# Patient Record
Sex: Female | Born: 1970 | Race: Black or African American | Hispanic: No | Marital: Married | Smoking: Current every day smoker
Health system: Southern US, Community
[De-identification: ages and names within clinical notes are randomized; demographics above are authoritative.]

## PROBLEM LIST (undated history)

## (undated) HISTORY — PX: RIB FRACTURE SURGERY: SHX2358

## (undated) HISTORY — PX: BACK SURGERY: SHX140

## (undated) HISTORY — PX: ORIF SCAPULAR FRACTURE: SUR958

## (undated) HISTORY — PX: CLAVICLE SURGERY: SHX598

## (undated) HISTORY — PX: FRACTURE SURGERY: SHX138

## (undated) HISTORY — PX: NECK SURGERY: SHX720

---

## 1997-12-26 ENCOUNTER — Other Ambulatory Visit: Admission: RE | Admit: 1997-12-26 | Discharge: 1997-12-26 | Payer: Self-pay | Admitting: Obstetrics and Gynecology

## 1998-01-16 ENCOUNTER — Observation Stay (HOSPITAL_COMMUNITY): Admission: RE | Admit: 1998-01-16 | Discharge: 1998-01-17 | Payer: Self-pay | Admitting: Obstetrics and Gynecology

## 1998-04-25 ENCOUNTER — Inpatient Hospital Stay (HOSPITAL_COMMUNITY): Admission: AD | Admit: 1998-04-25 | Discharge: 1998-04-25 | Payer: Self-pay | Admitting: Obstetrics and Gynecology

## 1998-04-28 ENCOUNTER — Inpatient Hospital Stay (HOSPITAL_COMMUNITY): Admission: AD | Admit: 1998-04-28 | Discharge: 1998-04-28 | Payer: Self-pay | Admitting: Obstetrics & Gynecology

## 1998-07-04 ENCOUNTER — Inpatient Hospital Stay (HOSPITAL_COMMUNITY): Admission: AD | Admit: 1998-07-04 | Discharge: 1998-07-04 | Payer: Self-pay | Admitting: Obstetrics & Gynecology

## 1998-07-05 ENCOUNTER — Inpatient Hospital Stay (HOSPITAL_COMMUNITY): Admission: AD | Admit: 1998-07-05 | Discharge: 1998-07-05 | Payer: Self-pay | Admitting: *Deleted

## 1998-07-13 ENCOUNTER — Inpatient Hospital Stay (HOSPITAL_COMMUNITY): Admission: AD | Admit: 1998-07-13 | Discharge: 1998-07-19 | Payer: Self-pay | Admitting: Obstetrics and Gynecology

## 1998-07-20 ENCOUNTER — Encounter (HOSPITAL_COMMUNITY): Admission: RE | Admit: 1998-07-20 | Discharge: 1998-10-18 | Payer: Self-pay | Admitting: Obstetrics and Gynecology

## 1998-08-09 ENCOUNTER — Other Ambulatory Visit: Admission: RE | Admit: 1998-08-09 | Discharge: 1998-08-09 | Payer: Self-pay | Admitting: Obstetrics and Gynecology

## 1999-05-05 ENCOUNTER — Emergency Department (HOSPITAL_COMMUNITY): Admission: EM | Admit: 1999-05-05 | Discharge: 1999-05-05 | Payer: Self-pay | Admitting: Emergency Medicine

## 1999-05-14 ENCOUNTER — Encounter: Admission: RE | Admit: 1999-05-14 | Discharge: 1999-05-14 | Payer: Self-pay | Admitting: General Practice

## 1999-05-14 ENCOUNTER — Encounter: Payer: Self-pay | Admitting: General Practice

## 1999-06-10 ENCOUNTER — Encounter: Admission: RE | Admit: 1999-06-10 | Discharge: 1999-06-10 | Payer: Self-pay | Admitting: General Practice

## 1999-06-10 ENCOUNTER — Encounter: Payer: Self-pay | Admitting: General Practice

## 2003-08-22 ENCOUNTER — Encounter (INDEPENDENT_AMBULATORY_CARE_PROVIDER_SITE_OTHER): Payer: Self-pay | Admitting: Specialist

## 2003-08-22 ENCOUNTER — Ambulatory Visit (HOSPITAL_COMMUNITY): Admission: RE | Admit: 2003-08-22 | Discharge: 2003-08-22 | Payer: Self-pay | Admitting: Obstetrics and Gynecology

## 2003-09-15 ENCOUNTER — Encounter (INDEPENDENT_AMBULATORY_CARE_PROVIDER_SITE_OTHER): Payer: Self-pay | Admitting: *Deleted

## 2003-09-15 ENCOUNTER — Ambulatory Visit (HOSPITAL_COMMUNITY): Admission: RE | Admit: 2003-09-15 | Discharge: 2003-09-15 | Payer: Self-pay | Admitting: Obstetrics and Gynecology

## 2003-10-24 ENCOUNTER — Emergency Department (HOSPITAL_COMMUNITY): Admission: EM | Admit: 2003-10-24 | Discharge: 2003-10-24 | Payer: Self-pay | Admitting: Emergency Medicine

## 2004-02-05 ENCOUNTER — Emergency Department (HOSPITAL_COMMUNITY): Admission: EM | Admit: 2004-02-05 | Discharge: 2004-02-05 | Payer: Self-pay | Admitting: Emergency Medicine

## 2004-04-05 ENCOUNTER — Encounter: Admission: RE | Admit: 2004-04-05 | Discharge: 2004-04-05 | Payer: Self-pay | Admitting: Internal Medicine

## 2004-04-18 ENCOUNTER — Encounter: Admission: RE | Admit: 2004-04-18 | Discharge: 2004-07-17 | Payer: Self-pay | Admitting: Orthopaedic Surgery

## 2004-09-15 ENCOUNTER — Emergency Department (HOSPITAL_COMMUNITY): Admission: EM | Admit: 2004-09-15 | Discharge: 2004-09-15 | Payer: Self-pay | Admitting: Emergency Medicine

## 2005-06-24 ENCOUNTER — Emergency Department (HOSPITAL_COMMUNITY): Admission: EM | Admit: 2005-06-24 | Discharge: 2005-06-24 | Payer: Self-pay | Admitting: Emergency Medicine

## 2006-06-26 ENCOUNTER — Inpatient Hospital Stay (HOSPITAL_COMMUNITY): Admission: EM | Admit: 2006-06-26 | Discharge: 2006-07-08 | Payer: Self-pay | Admitting: Emergency Medicine

## 2006-08-20 ENCOUNTER — Encounter: Admission: RE | Admit: 2006-08-20 | Discharge: 2006-08-20 | Payer: Self-pay | Admitting: Orthopedic Surgery

## 2006-10-13 ENCOUNTER — Encounter: Admission: RE | Admit: 2006-10-13 | Discharge: 2006-10-13 | Payer: Self-pay | Admitting: Orthopedic Surgery

## 2007-06-26 ENCOUNTER — Emergency Department (HOSPITAL_COMMUNITY): Admission: EM | Admit: 2007-06-26 | Discharge: 2007-06-26 | Payer: Self-pay | Admitting: Emergency Medicine
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
## 2009-11-08 ENCOUNTER — Encounter: Admission: RE | Admit: 2009-11-08 | Discharge: 2009-11-08 | Payer: Self-pay | Admitting: Internal Medicine

## 2009-11-14 ENCOUNTER — Encounter: Admission: RE | Admit: 2009-11-14 | Discharge: 2009-11-14 | Payer: Self-pay | Admitting: Internal Medicine
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
## 2010-07-05 NOTE — Op Note (Signed)
Darlene Donaldson, Darlene Donaldson                          ACCOUNT NO.:  [id number]   MEDICAL RECORD NO.:  [id number]                   PATIENT TYPE:  AMB   LOCATION:  SDC                                  FACILITY:  [facility]   PHYSICIAN:  Maxie Better, M.D.            DATE OF BIRTH:  29-Sep-1970   DATE OF PROCEDURE:  08/22/2003  DATE OF DISCHARGE:                                 OPERATIVE REPORT   PREOPERATIVE DIAGNOSIS:  Blighted ovum.   POSTOPERATIVE DIAGNOSIS:  Blighted ovum.   PROCEDURE:  Suction dilatation and evacuation.   ANESTHESIA:  MAC paracervical block.   SURGEON:  Maxie Better, M.D.   INDICATIONS FOR PROCEDURE:  A 40 year old gravida 30, para 2-0-11-2 married  black female who was found to have a blighted ovum on ultrasound on August 02, 2003, and who presents for surgical management after spontaneous miscarriage  did not occur.  Risks and benefits of the procedure had been explained to  the patient.  Consent was signed and the patient was transferred to the  operating room.   DESCRIPTION OF PROCEDURE:  Under adequate monitored anesthesia, the patient  was placed in the dorsal lithotomy position.  She was sterilely prepped and  draped in the usual fashion.  The bladder was catheterized for a moderate  amount of urine.  Examination under anesthesia revealed seven to eight-week  __________ uterus, no adnexal masses could be appreciated.  Bivalve speculum  was placed in the vagina.  Shortened cervix was noted.  There were 20 mL of  1% Nesacaine injected paracervically.  The anterior lip of the cervix was  then grasped with single-tooth tenaculum.  Cervix was serially dilated up to  #25 [facility] dilator.  A 37 mm curved suction cannula was introduced  into the  uterine cavity.  Cavity was suction curetted and resuctioned until tissue  was felt to have been all removed, at which time all instruments were then  removed from the vagina.  Specimen labeled products of conception was  sent  to pathology.   ESTIMATED BLOOD LOSS:  Estimated blood loss was about 100 mL.   COMPLICATIONS:  None.   Maternal blood type was O positive.  The patient tolerated the procedure  well and was transferred to the recovery room in stable condition.                                               Maxie Better, M.D.    [place]/MEDQ  D:  08/22/2003  T:  08/22/2003  Job:  [id number]

## 2010-07-05 NOTE — Op Note (Signed)
NAMESITARA, CASHWELL                          ACCOUNT NO.:  [id number]   MEDICAL RECORD NO.:  [id number]                   PATIENT TYPE:  AMB   LOCATION:  SDC                                  FACILITY:  [facility]   PHYSICIAN:  Richardean Sale, M.D.                DATE OF BIRTH:  05/19/1970   DATE OF PROCEDURE:  09/15/2003  DATE OF DISCHARGE:                                 OPERATIVE REPORT   PREOPERATIVE DIAGNOSES:  Retained products of conception.   POSTOPERATIVE DIAGNOSES:  Retained products of conception.   PROCEDURE:  Dilation and evacuation of uterus, sharp curettage.   SURGEON:  Richardean Sale, M.D.   ANESTHESIA:  General endotracheal.   COMPLICATIONS:  None.   ESTIMATED BLOOD LOSS:  Minimal.   FINDINGS:  Small amount of retained products of conception.   SPECIMENS:  Products of conception sent to pathology.   INDICATIONS FOR PROCEDURE:  This is a 40 year old African-American female,  gravida 67, para 2-0-12-2 status post D&E on August 22, 2003 for blighted ovum  at six weeks gestation whose had persistent vaginal bleeding and cramping.  She was found to have retained products of conception on ultrasound on September 15, 2003. The patient presents for repeat D&C due to retained products. She  has had no fever, chills or sweats and has been on doxycycline 100 mg p.o.  daily since her procedure.   Prior to the procedure, the risk of the procedure were reviewed with the  patient and informed consent was obtained. We discussed the risk of  hemorrhage requiring transfusion, infection requiring intravenous  antibiotics which could result in infertility in the future, injury to the  uterus which would require exploratory surgery either by laparoscopy or  laparotomy with subsequent injury to the bowel, the bladder or other  abdominal organs which would require additional surgery. We also discussed  the risk of anesthesia. The patient voiced an understanding of all these  risks and  agreed to proceed. Informed consent was obtained and all questions  were answered prior to proceed to the OR.   DESCRIPTION OF PROCEDURE:  The patient was taken to the operating room where  she was given conscious sedation, she was then prepped and draped in the  usual sterile fashion with a Hibiclens prep. A red rubber catheter was used  to drain the bladder. A manual exam was performed which confirmed the  presence of a small anteverted uterus in the midline with no palpable  masses. A speculum was then placed into the vagina and 2 mL of 1% Nesacaine  were then injected at the 12 o'clock position and a single tooth tenaculum  was then used to grasp the anterior lip of the cervix and a paracervical  block was then administered using a total of 22 mL of 1% Nesacaine. The  uterus was then sounded and sounded to 7 cm. The cervix was then very  carefully dilated with Hegar dilators and the #7 suction curette was then  introduced and suction was applied. A small amount of necrotic appearing  products of conception were removed with suction, this was followed by sharp  curettage which resulted in removal of additional retained products. This  was all sent to pathology labeled as products of conception.  A sharp  curettage was then again performed until a gritty texture was noted on all  four quadrants of the uterine cavity.  Any additional products were sent to  pathology as well.  Once the [facility] was complete, the single tooth tenaculum  was removed, there was some bleeding noted from the tenaculum site which was  cauterized with silver nitrate. There was no bleeding coming from the  cervical os.  Once the single tooth tenaculum site was hemostatic, the  speculum was removed, the patient was taken out of dorsal lithotomy position  and awakened from her anesthesia.  She was taken to the recovery room awake  and in stable condition. There were no complications and no evidence of  perforation  throughout the entire procedure.                                               Richardean Sale, M.D.    JW/MEDQ  D:  09/15/2003  T:  09/15/2003  Job:  [id number]

## 2013-11-25 ENCOUNTER — Emergency Department (HOSPITAL_COMMUNITY)
Admission: EM | Admit: 2013-11-25 | Discharge: 2013-11-25 | Discharge status: Against Medical Advice | Payer: Medicare Other | Attending: Emergency Medicine | Admitting: Emergency Medicine

## 2013-11-25 ENCOUNTER — Encounter (HOSPITAL_COMMUNITY): Payer: Self-pay | Admitting: Emergency Medicine

## 2013-11-25 DIAGNOSIS — R350 Frequency of micturition: Secondary | ICD-10-CM | POA: Diagnosis not present

## 2013-11-25 DIAGNOSIS — R200 Anesthesia of skin: Secondary | ICD-10-CM | POA: Insufficient documentation

## 2013-11-25 LAB — URINALYSIS, ROUTINE W REFLEX MICROSCOPIC
BILIRUBIN URINE: NEGATIVE
Glucose, UA: NEGATIVE mg/dL
Hgb urine dipstick: NEGATIVE
Ketones, ur: NEGATIVE mg/dL
LEUKOCYTES UA: NEGATIVE
NITRITE: NEGATIVE
PH: 6.5 (ref 5.0–8.0)
Protein, ur: NEGATIVE mg/dL
SPECIFIC GRAVITY, URINE: 1.002 — AB (ref 1.005–1.030)
UROBILINOGEN UA: 0.2 mg/dL (ref 0.0–1.0)
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
## 2013-11-25 NOTE — ED Notes (Signed)
Pt left AMA °

## 2014-07-14 ENCOUNTER — Other Ambulatory Visit (HOSPITAL_COMMUNITY): Payer: Medicare Other

## 2022-01-11 ENCOUNTER — Emergency Department (HOSPITAL_COMMUNITY)
Admission: EM | Admit: 2022-01-11 | Discharge: 2022-01-11 | Discharge status: Against Medical Advice | Payer: Medicare Other | Attending: Emergency Medicine | Admitting: Emergency Medicine

## 2022-01-11 ENCOUNTER — Emergency Department (HOSPITAL_COMMUNITY): Payer: Medicare Other

## 2022-01-11 ENCOUNTER — Encounter (HOSPITAL_COMMUNITY): Payer: Self-pay | Admitting: Emergency Medicine

## 2022-01-11 ENCOUNTER — Other Ambulatory Visit: Payer: Self-pay

## 2022-01-11 ENCOUNTER — Ambulatory Visit (HOSPITAL_COMMUNITY)
Admission: EM | Admit: 2022-01-11 | Discharge: 2022-01-11 | Disposition: A | Discharge status: Home / Self Care | Payer: Medicare Other | Attending: Emergency Medicine | Admitting: Emergency Medicine

## 2022-01-11 DIAGNOSIS — Z859 Personal history of malignant neoplasm, unspecified: Secondary | ICD-10-CM | POA: Diagnosis not present

## 2022-01-11 DIAGNOSIS — Z5321 Procedure and treatment not carried out due to patient leaving prior to being seen by health care provider: Secondary | ICD-10-CM | POA: Insufficient documentation

## 2022-01-11 DIAGNOSIS — I10 Essential (primary) hypertension: Secondary | ICD-10-CM | POA: Insufficient documentation

## 2022-01-11 DIAGNOSIS — R0602 Shortness of breath: Secondary | ICD-10-CM | POA: Diagnosis not present

## 2022-01-11 DIAGNOSIS — J45909 Unspecified asthma, uncomplicated: Secondary | ICD-10-CM | POA: Diagnosis not present

## 2022-01-11 DIAGNOSIS — R079 Chest pain, unspecified: Secondary | ICD-10-CM | POA: Insufficient documentation

## 2022-01-11 LAB — BASIC METABOLIC PANEL
Anion gap: 14 (ref 5–15)
BUN: 11 mg/dL (ref 6–20)
CO2: 23 mmol/L (ref 22–32)
Calcium: 9.7 mg/dL (ref 8.9–10.3)
Chloride: 105 mmol/L (ref 98–111)
Creatinine, Ser: 0.94 mg/dL (ref 0.44–1.00)
GFR, Estimated: >60 mL/min (ref >60)
Glucose, Bld: 99 mg/dL (ref 70–99)
Potassium: 3 mmol/L — ABNORMAL LOW (ref 3.5–5.1)
Sodium: 142 mmol/L (ref 135–145)

## 2022-01-11 LAB — I-STAT BETA HCG BLOOD, ED (MC, WL, AP ONLY): I-stat hCG, quantitative: <5 m[IU]/mL (ref <5)

## 2022-01-11 LAB — CBC
HCT: 44.2 % (ref 36.0–46.0)
Hemoglobin: 14.1 g/dL (ref 12.0–15.0)
MCH: 27.7 pg (ref 26.0–34.0)
MCHC: 31.9 g/dL (ref 30.0–36.0)
MCV: 86.8 fL (ref 80.0–100.0)
Platelets: 335 10*3/uL (ref 150–400)
RBC: 5.09 MIL/uL (ref 3.87–5.11)
RDW: 13.4 % (ref 11.5–15.5)
WBC: 5.4 10*3/uL (ref 4.0–10.5)
nRBC: 0 % (ref 0.0–0.2)

## 2022-01-11 LAB — TROPONIN I (HIGH SENSITIVITY): Troponin I (High Sensitivity): 4 ng/L (ref <18)

## 2022-01-11 NOTE — ED Notes (Signed)
Pt decided to leave while waiting for a room.  
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
## 2022-01-11 NOTE — ED Notes (Signed)
EKG and report provided to J. Ward, PA. States pt OK to wait in waiting area.  Discussed with pt that if she is concerned for her heart and wishes to be sure it is OK, she would need to be seen in an ED. Pt states she is wanting peace of mind by having a full cardiac work-up, so she would rather go to ED at this time.  Patient is being discharged from the Urgent Care and sent to the Emergency Department via private vehicle . Per J. Ward, PA, patient is in need of higher level of care due to neck and chest pain with pt requesting full cardiac work-up. Patient is aware and verbalizes understanding of plan of care.  Vitals:   01/11/22 1427  BP: (!) 167/91  Pulse: (!) 109  Resp: 20  Temp: 98.1 F (36.7 C)  SpO2: 98%

## 2022-01-11 NOTE — ED Provider Triage Note (Signed)
Emergency Medicine Provider Triage Evaluation Note HPI Tashaya Ancrum , a 51 y.o. female  was evaluated in triage.  Pt complains of chest pain.  History of hypertension, asthma and remote history of cancer.  Patient states she has had chest pain off and on for several months now.  Chest pain located in the left lateral aspect of her chest just below her breast. States she had a "heart attack in October".  States that this morning her chest pain was worse with associated shortness of breath.  Chest pain worse with exertion, and shortness of breath worse with exertion.  Denies lower extremity edema or calf tenderness.  Patient also mentions she has had multiple occurrences of syncope over the last year and a half.  Her face becomes hot and cold and she "passes out".  Review of Systems  Positive: See above Negative: See above  Physical Exam  BP (!) 157/98 (BP Location: Right Arm)   Pulse (!) 109   Temp 98 F (36.7 C)   Resp 17   SpO2 94%  Gen:   Awake, no distress   Resp:  Normal effort  MSK:   Moves extremities without difficulty  Other:    Medical Decision Making  Medically screening exam initiated at 3:16 PM.  Appropriate orders placed.  Natsha Guidry was informed that the remainder of the evaluation will be completed by another provider, this initial triage assessment does not replace that evaluation, and the importance of remaining in the ED until their evaluation is complete.  Work up [employer], Sharlett Iles, PA-C 01/11/22 1521

## 2022-01-11 NOTE — ED Triage Notes (Signed)
Reports hx 10/16 - "mini heart attack" 2 days ago started with constant left lateral neck pain extending into trapezius with swelling to site. Also c/o redness in left eye onset yesterday. Also c/o "my face getting really hot, then really cold" off/on throughout this year along with syncopal episodes throughout the year. C/O left lateral chest pain. Pt tearful.
